# Patient Record
Sex: Male | Born: 1970 | Race: Black or African American | Hispanic: No | Marital: Married | Smoking: Never smoker
Health system: Southern US, Community
[De-identification: ages and names within clinical notes are randomized; demographics above are authoritative.]

---

## 2021-12-27 ENCOUNTER — Emergency Department (HOSPITAL_COMMUNITY): Payer: 59

## 2021-12-27 ENCOUNTER — Emergency Department (HOSPITAL_COMMUNITY)
Admission: EM | Admit: 2021-12-27 | Discharge: 2021-12-27 | Disposition: A | Discharge status: Home / Self Care | Payer: 59 | Attending: Emergency Medicine | Admitting: Emergency Medicine

## 2021-12-27 ENCOUNTER — Other Ambulatory Visit: Payer: Self-pay

## 2021-12-27 ENCOUNTER — Encounter (HOSPITAL_COMMUNITY): Payer: Self-pay | Admitting: Emergency Medicine

## 2021-12-27 DIAGNOSIS — R079 Chest pain, unspecified: Secondary | ICD-10-CM | POA: Diagnosis present

## 2021-12-27 DIAGNOSIS — I10 Essential (primary) hypertension: Secondary | ICD-10-CM | POA: Diagnosis not present

## 2021-12-27 LAB — TROPONIN I (HIGH SENSITIVITY)
Troponin I (High Sensitivity): 7 ng/L (ref <18)
Troponin I (High Sensitivity): 7 ng/L (ref <18)

## 2021-12-27 LAB — CBC
HCT: 42.3 % (ref 39.0–52.0)
Hemoglobin: 14.8 g/dL (ref 13.0–17.0)
MCH: 32.7 pg (ref 26.0–34.0)
MCHC: 35 g/dL (ref 30.0–36.0)
MCV: 93.4 fL (ref 80.0–100.0)
Platelets: 303 10*3/uL (ref 150–400)
RBC: 4.53 MIL/uL (ref 4.22–5.81)
RDW: 11.4 % — ABNORMAL LOW (ref 11.5–15.5)
WBC: 6 10*3/uL (ref 4.0–10.5)
nRBC: 0 % (ref 0.0–0.2)

## 2021-12-27 LAB — BASIC METABOLIC PANEL
Anion gap: 7 (ref 5–15)
BUN: 14 mg/dL (ref 6–20)
CO2: 27 mmol/L (ref 22–32)
Calcium: 9.9 mg/dL (ref 8.9–10.3)
Chloride: 102 mmol/L (ref 98–111)
Creatinine, Ser: 1.29 mg/dL — ABNORMAL HIGH (ref 0.61–1.24)
GFR, Estimated: >60 mL/min (ref >60)
Glucose, Bld: 105 mg/dL — ABNORMAL HIGH (ref 70–99)
Potassium: 4.1 mmol/L (ref 3.5–5.1)
Sodium: 136 mmol/L (ref 135–145)

## 2021-12-27 NOTE — Discharge Instructions (Signed)
Please follow-up with your primary care doctor to discuss your symptoms from today.  Come back to ER if you develop worsening chest pain, worsening dizziness, any episodes of passing out, difficulty breathing or other new concerning symptom. ?

## 2021-12-27 NOTE — ED Provider Notes (Signed)
?[facility] ?Provider Note ? ? ?CSN: [id number] ?Arrival date & time: 12/27/21  0234 ? ?  ? ?History ? ?Chief Complaint  ?Patient presents with  ? Chest Pain  ? ? ?Craig Ortiz is a 51 y.o. male.  Presented to the emergency department due to concern for chest pain.  Patient reports he had an episode of chest pain this morning, primarily left-sided, lasted for a few minutes and then went away.  Was not associated with any sort of exertion.  States that he checked his blood pressure and it was elevated.  States that high blood pressure runs in his family.  He does not take any medicine for his blood pressure.  He denies any family history of coronary artery disease.  Non-smoker.  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  Denies difficulty in breathing.  States that he felt somewhat dizzy/lightheaded.  No room spinning sensation. ? ?HPI ? ?  ? ?Home Medications ?Prior to Admission medications   ?Not on File  ?   ? ?Allergies    ?Flagyl [metronidazole]   ? ?Review of Systems   ?Review of Systems  ?Constitutional:  Negative for chills and fever.  ?HENT:  Negative for ear pain and sore throat.   ?Eyes:  Negative for pain and visual disturbance.  ?Respiratory:  Negative for cough and shortness of breath.   ?Cardiovascular:  Positive for chest pain. Negative for palpitations.  ?Gastrointestinal:  Negative for abdominal pain and vomiting.  ?Genitourinary:  Negative for dysuria and hematuria.  ?Musculoskeletal:  Negative for arthralgias and back pain.  ?Skin:  Negative for color change and rash.  ?Neurological:  Positive for light-headedness. Negative for seizures and syncope.  ?All other systems reviewed and are negative. ? ?Physical Exam ?Updated Vital Signs ?BP (!) 147/89   Pulse 75   Temp 97.6 ?F (36.4 ?C) (Oral)   Resp 17   SpO2 99%  ?Physical Exam ?Vitals and nursing note reviewed.  ?Constitutional:   ?   General: He is not in acute distress. ?   Appearance: He is well-developed.  ?HENT:  ?    Head: Normocephalic and atraumatic.  ?Eyes:  ?   Conjunctiva/sclera: Conjunctivae normal.  ?Cardiovascular:  ?   Rate and Rhythm: Normal rate and regular rhythm.  ?   Heart sounds: No murmur heard. ?Pulmonary:  ?   Effort: Pulmonary effort is normal. No respiratory distress.  ?   Breath sounds: Normal breath sounds.  ?Abdominal:  ?   Palpations: Abdomen is soft.  ?   Tenderness: There is no abdominal tenderness.  ?Musculoskeletal:     ?   General: No swelling.  ?   Cervical back: Neck supple.  ?Skin: ?   General: Skin is warm and dry.  ?   Capillary Refill: Capillary refill takes less than 2 seconds.  ?Neurological:  ?   Mental Status: He is alert.  ?Psychiatric:     ?   Mood and Affect: Mood normal.  ? ? ?ED Results / Procedures / Treatments   ?Labs ?(all labs ordered are listed, but only abnormal results are displayed) ?Labs Reviewed  ?BASIC METABOLIC PANEL - Abnormal; Notable for the following components:  ?    Result Value  ? Glucose, Bld 105 (*)   ? Creatinine, Ser 1.29 (*)   ? All other components within normal limits  ?CBC - Abnormal; Notable for the following components:  ? RDW 11.4 (*)   ? All other components within normal limits  ?  TROPONIN I (HIGH SENSITIVITY)  ?TROPONIN I (HIGH SENSITIVITY)  ? ? ?EKG ?EKG Interpretation ? ?Date/Time:  Sunday December 27 2021 02:49:09 EDT ?Ventricular Rate:  88 ?PR Interval:  160 ?QRS Duration: 84 ?QT Interval:  322 ?QTC Calculation: 389 ?R Axis:   59 ?Text Interpretation: Normal sinus rhythm T wave abnormality, consider inferior ischemia Abnormal ECG No previous ECGs available Confirmed by Madalyn Rob [phone]) on 12/27/2021 7:08:33 AM ? ?Radiology ?DG Chest 2 View ? ?Result Date: 12/27/2021 ?CLINICAL DATA:  Chest pain EXAM: CHEST - 2 VIEW COMPARISON:  None. FINDINGS: Cardiac contour at the upper limit of normal. Aortic tortuosity. No focal pulmonary opacity. No pleural effusion or pneumothorax. No acute osseous abnormality. IMPRESSION: Borderline cardiomegaly.  No acute  pulmonary process. Electronically Signed   By: Merilyn Baba M.D.   On: 12/27/2021 03:08   ? ?Procedures ?Procedures  ? ? ?Medications Ordered in ED ?Medications - No data to display ? ?ED Course/ Medical Decision Making/ A&P ?  ?                        ?Medical Decision Making ?Amount and/or Complexity of Data Reviewed ?Labs: ordered. ?Radiology: ordered. ? ? ?51 year old male presenting to ER due to concern for chest pain episode as well as episode of feeling lightheaded/dizzy.  On exam he appears well in no distress.  Vitals are stable, some hypertension.  EKG with nonspecific T wave abnormality, troponin x2 is within normal limits, he has no ongoing pain.  Doubt acute coronary syndrome.  CXR is negative for infiltrate or edema.  Patient has no ongoing symptoms right now.  Given the work-up completed today and lack of ongoing symptoms and current vital signs, feel he can be discharged and managed in the outpatient setting.  I advised following up with his primary care doctor.  Reviewed return precautions and discharged. ? ? ? ?After the discussed management above, the patient was determined to be safe for discharge.  The patient was in agreement with this plan and all questions regarding their care were answered.  ED return precautions were discussed and the patient will return to the ED with any significant worsening of condition. ? ? ? ? ? ? ? ? ?Final Clinical Impression(s) / ED Diagnoses ?Final diagnoses:  ?Chest pain, unspecified type  ?Hypertension, unspecified type  ? ? ?Rx / DC Orders ?ED Discharge Orders   ? ? None  ? ?  ? ? ?  ?Lucrezia Starch, MD ?12/28/21 2330 ? ?

## 2021-12-27 NOTE — ED Triage Notes (Signed)
Pt reported to ED after feeling dizzy and having pain to center of chest that moves to the left side and blood pressure was elevated. Also endorses occasional shortness of breath. Denies any nausea, vomiting or back pain.  ?

## 2023-04-27 IMAGING — CR DG CHEST 2V
2 series · 2 of 2 positions shown · non-contrast
Comparison: None.

CLINICAL DATA: Chest pain

EXAM:
CHEST - 2 VIEW

[chest pa]
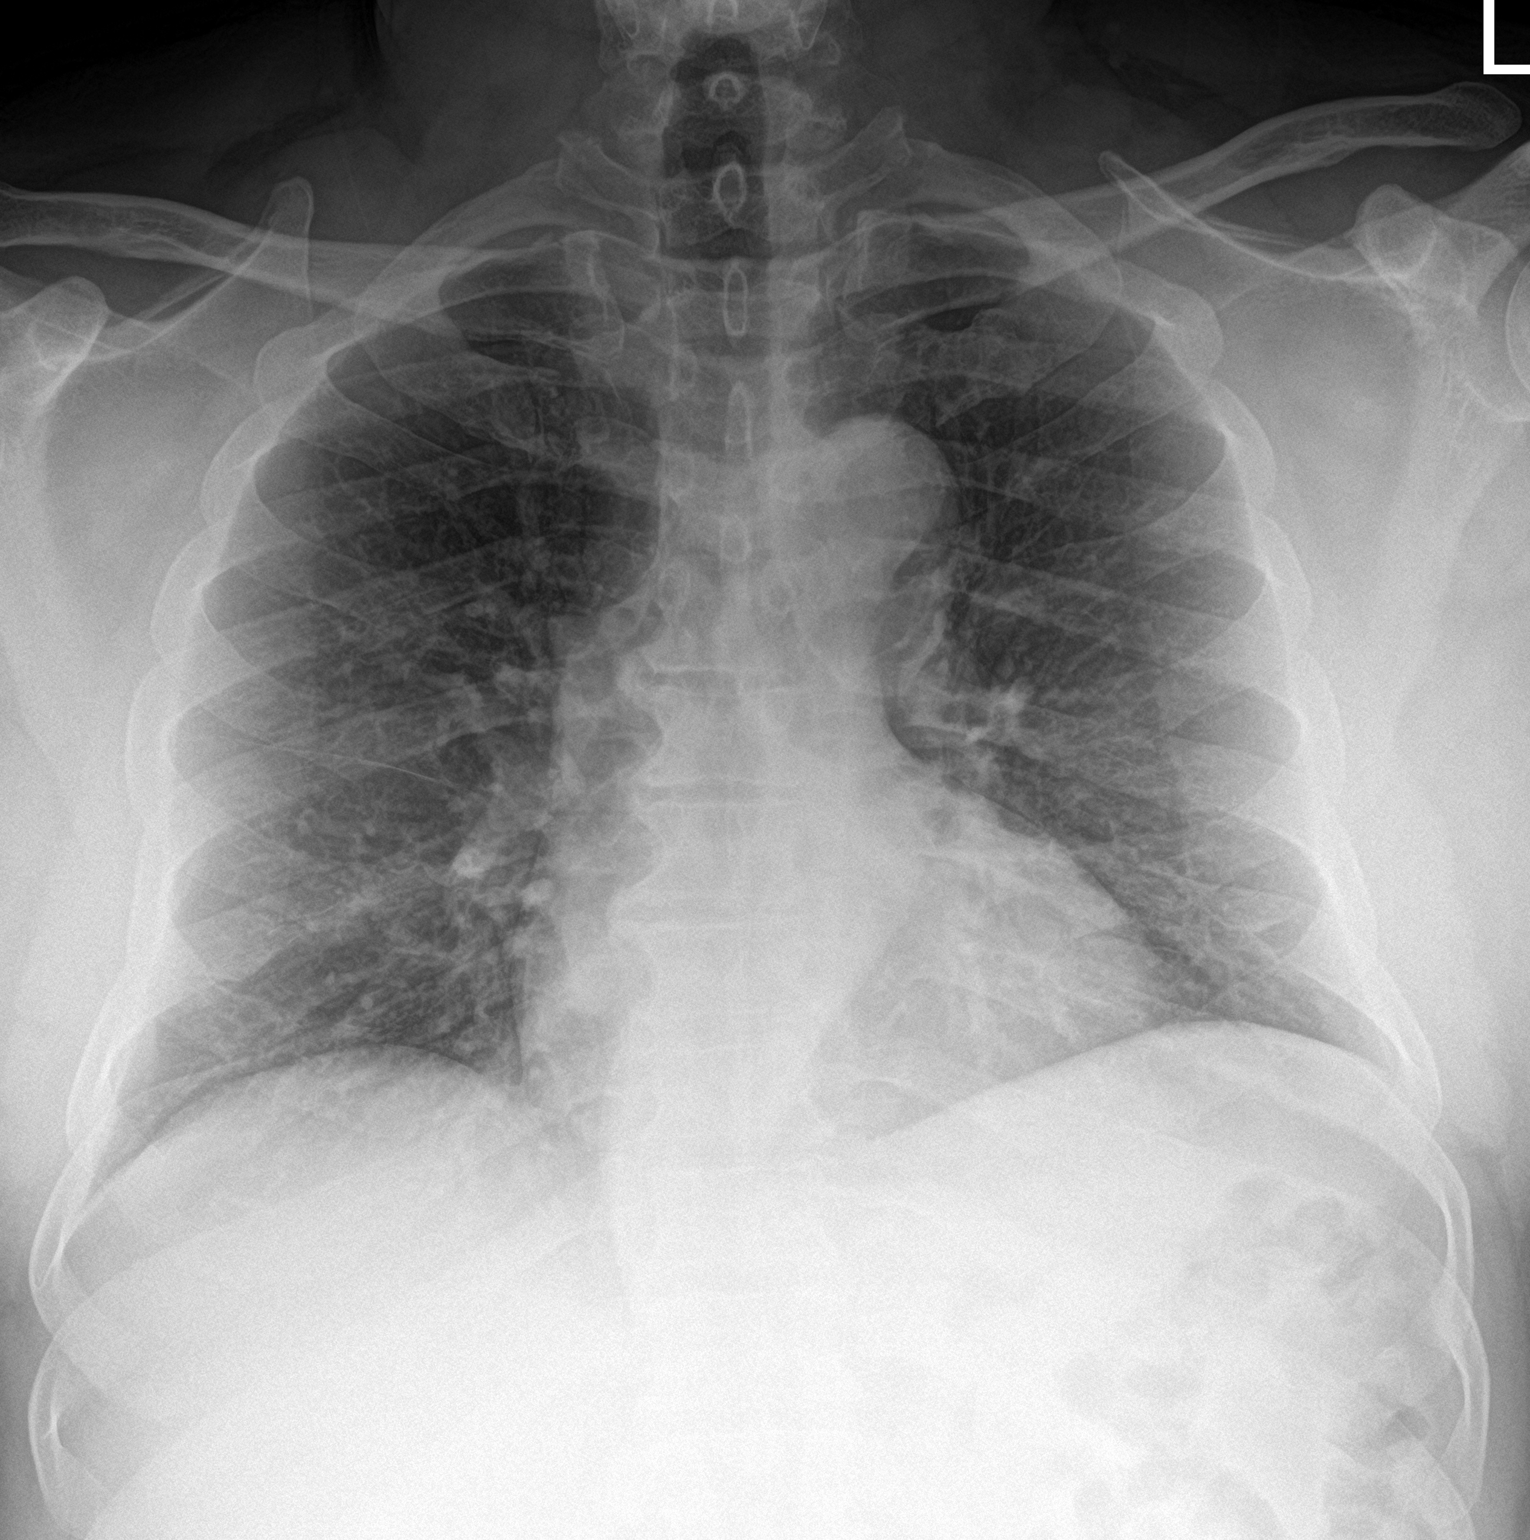

[chest lat]
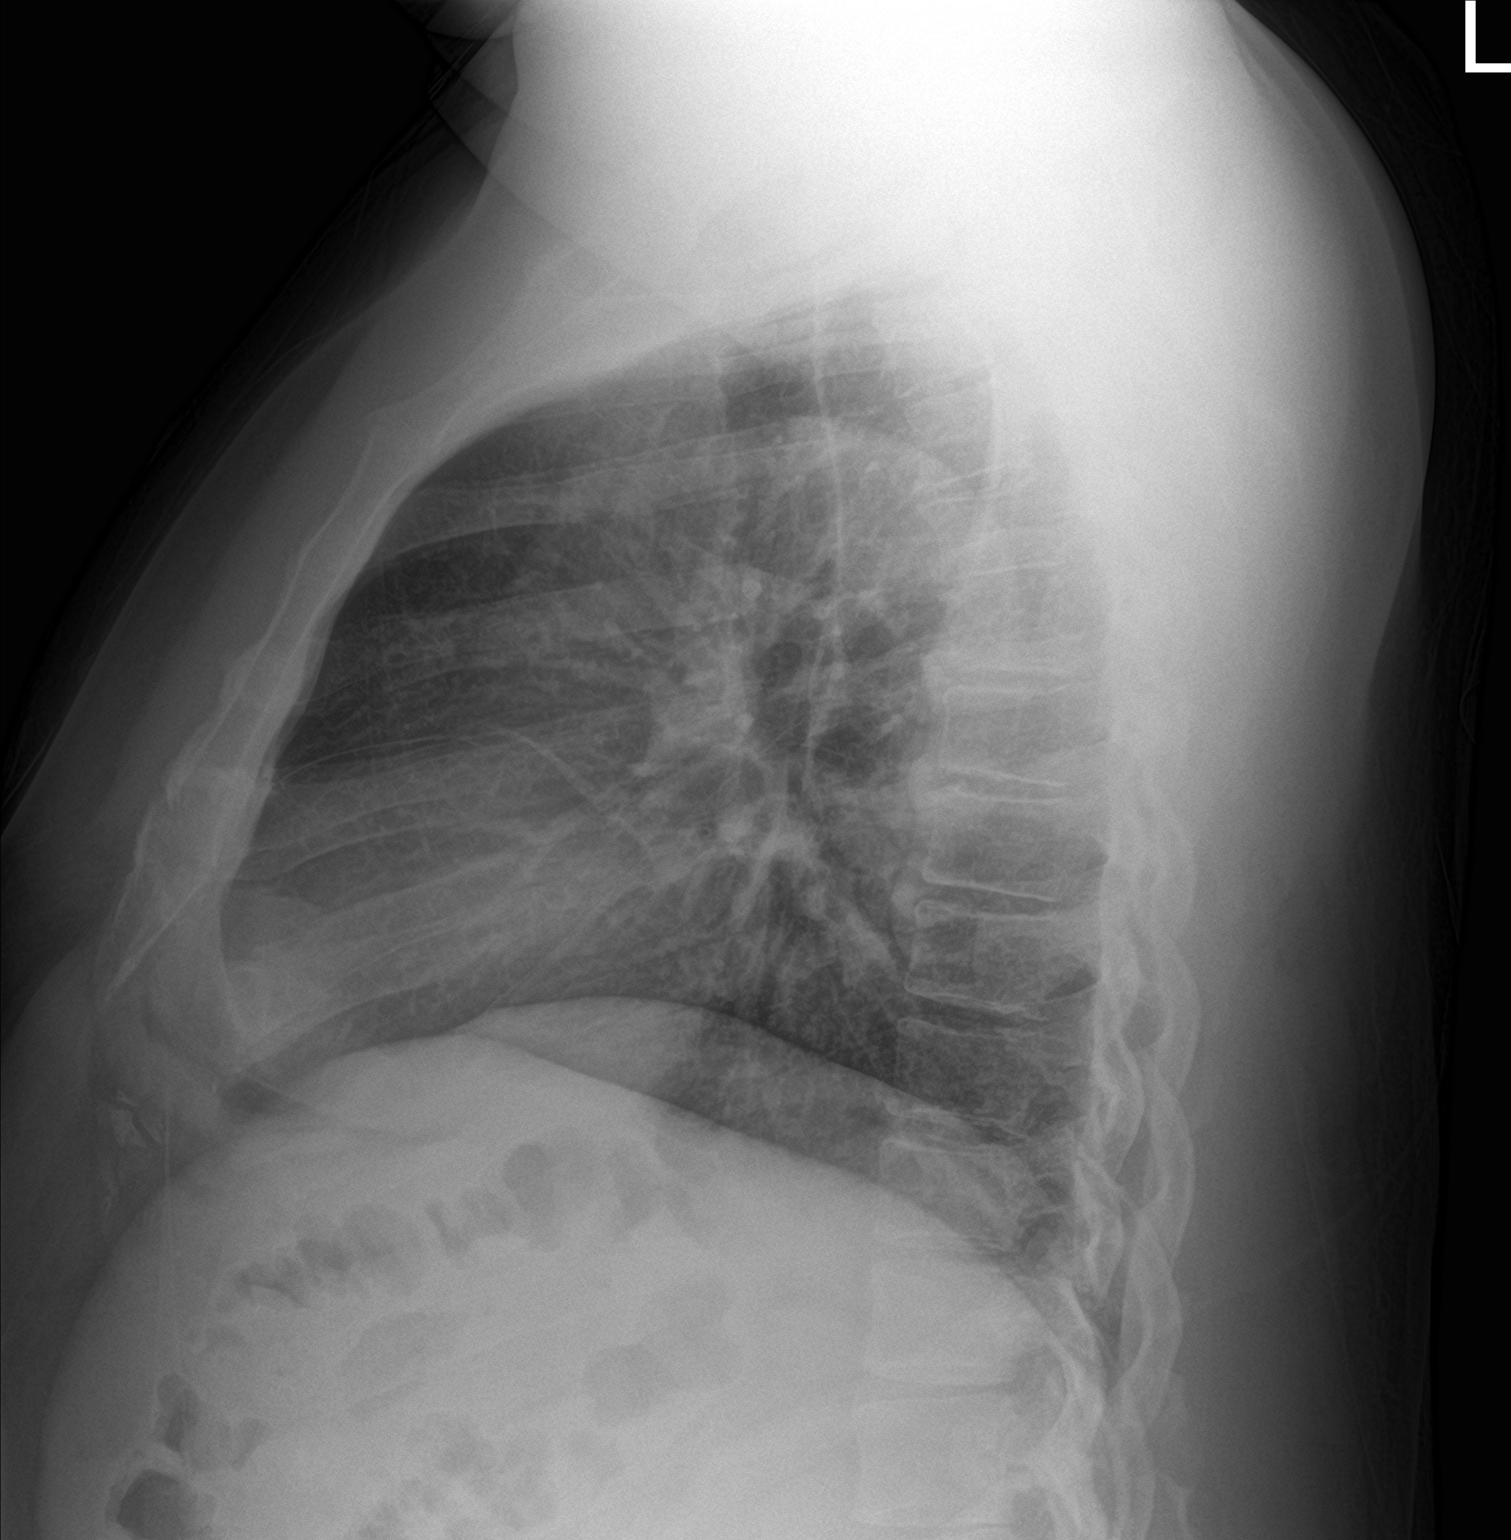

[2 of 2 positions shown; findings below may reference images not displayed]

FINDINGS: Cardiac contour at the upper limit of normal. Aortic tortuosity. No
focal pulmonary opacity. No pleural effusion or pneumothorax. No
acute osseous abnormality.
IMPRESSION: Borderline cardiomegaly.  No acute pulmonary process.
